# Patient Record
Sex: Male | Born: 1965 | Race: White | Hispanic: No | Marital: Married | Smoking: Light tobacco smoker
Health system: Southern US, Community
[De-identification: ages and names within clinical notes are randomized; demographics above are authoritative.]

## PROBLEM LIST (undated history)

## (undated) DIAGNOSIS — E119 Type 2 diabetes mellitus without complications: Secondary | ICD-10-CM

## (undated) HISTORY — PX: OTHER SURGICAL HISTORY: SHX169

## (undated) HISTORY — DX: Type 2 diabetes mellitus without complications: E11.9

---

## 2009-08-09 ENCOUNTER — Ambulatory Visit (HOSPITAL_COMMUNITY): Admission: RE | Admit: 2009-08-09 | Discharge: 2009-08-09 | Payer: Self-pay | Admitting: Urology

## 2011-06-03 IMAGING — CR DG ABDOMEN 1V
2 series · 2 of 2 positions shown · non-contrast
Comparison: None available.

CLINICAL DATA: Left renal pelvis calculus.  Through the operative
examination for lithotripsy.

ABDOMEN - 1 VIEW

[t abdomen supine (1 of 2)]
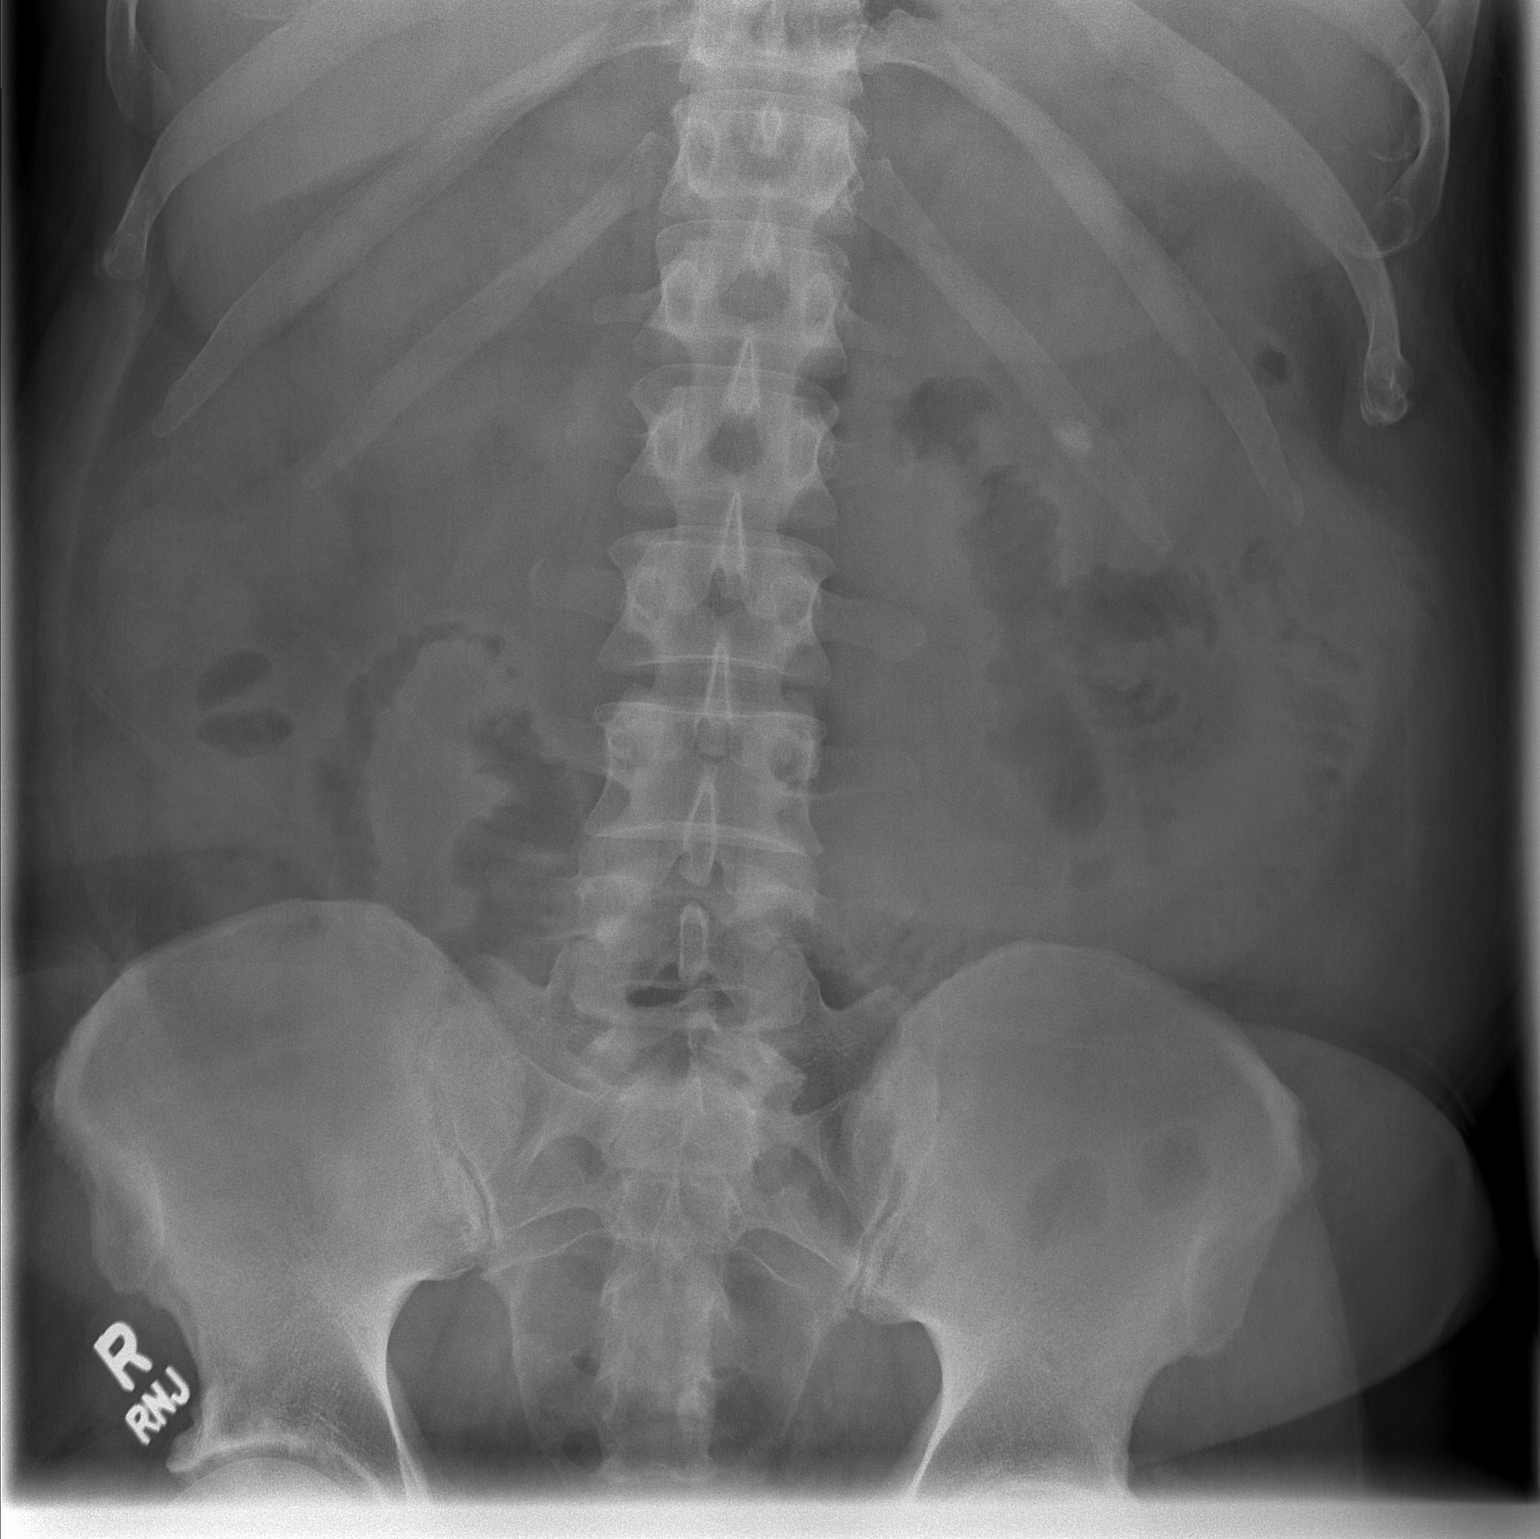

[t abdomen supine (2 of 2)]
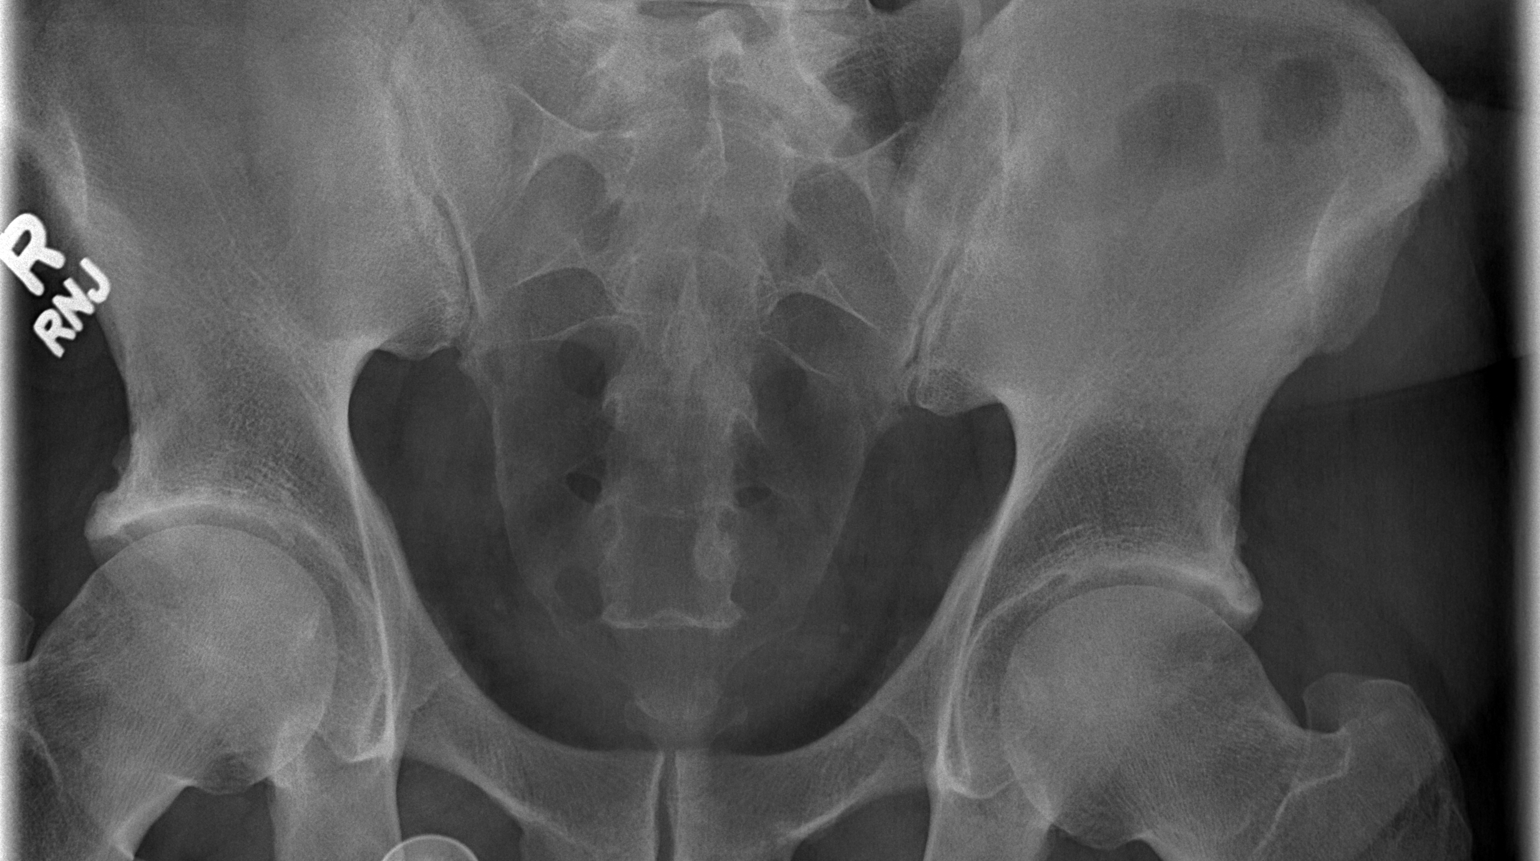

[2 of 2 positions shown; findings below may reference images not displayed]

FINDINGS: 1 cm calculus is projected over the left inferior renal
pelvis.  The bowel gas pattern appears within normal limits.  The
bones are unremarkable.  No right renal calculi are identified.
The phleboliths present in the anatomic pelvis.
IMPRESSION: 1 cm left inferior renal pelvis calculus.

## 2013-03-17 ENCOUNTER — Ambulatory Visit (INDEPENDENT_AMBULATORY_CARE_PROVIDER_SITE_OTHER): Payer: Managed Care, Other (non HMO) | Admitting: Emergency Medicine

## 2013-03-17 VITALS — BP 130/84 | HR 82 | Temp 98.0°F | Resp 18 | Ht 73.0 in | Wt 272.6 lb

## 2013-03-17 DIAGNOSIS — Z202 Contact with and (suspected) exposure to infections with a predominantly sexual mode of transmission: Secondary | ICD-10-CM

## 2013-03-17 DIAGNOSIS — E118 Type 2 diabetes mellitus with unspecified complications: Secondary | ICD-10-CM

## 2013-03-17 DIAGNOSIS — N529 Male erectile dysfunction, unspecified: Secondary | ICD-10-CM

## 2013-03-17 DIAGNOSIS — Z0000 Encounter for general adult medical examination without abnormal findings: Secondary | ICD-10-CM

## 2013-03-17 DIAGNOSIS — E119 Type 2 diabetes mellitus without complications: Secondary | ICD-10-CM | POA: Insufficient documentation

## 2013-03-17 DIAGNOSIS — E1165 Type 2 diabetes mellitus with hyperglycemia: Secondary | ICD-10-CM

## 2013-03-17 DIAGNOSIS — R635 Abnormal weight gain: Secondary | ICD-10-CM

## 2013-03-17 LAB — POCT CBC
HCT, POC: 43.7 % (ref 43.5–53.7)
Hemoglobin: 14.1 g/dL (ref 14.1–18.1)
Lymph, poc: 2.3 (ref 0.6–3.4)
MCH, POC: 30.1 pg (ref 27–31.2)
MCHC: 32.3 g/dL (ref 31.8–35.4)
WBC: 7.9 10*3/uL (ref 4.6–10.2)

## 2013-03-17 LAB — POCT URINALYSIS DIPSTICK
Glucose, UA: 100
Ketones, UA: NEGATIVE
Spec Grav, UA: 1.025
Urobilinogen, UA: 0.2

## 2013-03-17 MED ORDER — SILDENAFIL CITRATE 100 MG PO TABS: 50.0000 mg | ORAL_TABLET | Freq: Every day | ORAL | Status: DC | PRN

## 2013-03-17 MED ORDER — METFORMIN HCL 500 MG PO TABS: ORAL_TABLET | ORAL | Status: DC

## 2013-03-17 NOTE — Progress Notes (Signed)
  Subjective:    Patient ID: Timothy Richards, male    DOB: [birth year], 47 y.o.   MRN: [id number]  HPI Patient comes into office today for a CPE     Review of Systems  Cardiovascular: Positive for leg swelling.       Right lower leg has some swelling to it  Genitourinary: Negative for dysuria, frequency, hematuria, discharge, penile swelling, scrotal swelling, penile pain and testicular pain.       Patient is worried that his penis isn't functioning right Sometimes it stay erect sometimes itll start off erect then goes down  Neurological: Positive for numbness.       And tingling in both of his lower legs       Objective:   Physical Exam HEENT exam is unremarkable. Neck is supple. Chest is clear to auscultation and percussion. Heart regular rate no murmurs rubs or gallops. Abdomen soft nontender. GU exam is normal. Rectal reveals normal prostate. Extremities show 1+ edema both lower legs.  Results for orders placed in visit on 03/17/13  POCT CBC      Result Value Range   WBC 7.9  4.6 - 10.2 K/uL   Lymph, poc 2.3  0.6 - 3.4   POC LYMPH PERCENT 28.6  10 - 50 %L   MID (cbc) 0.4  0 - 0.9   POC MID % 5.1  0 - 12 %M   POC Granulocyte 5.2  2 - 6.9   Granulocyte percent 66.3  37 - 80 %G   RBC 4.68 (*) 4.69 - 6.13 M/uL   Hemoglobin 14.1  14.1 - 18.1 g/dL   HCT, POC 40.9  81.1 - 53.7 %   MCV 93.4  80 - 97 fL   MCH, POC 30.1  27 - 31.2 pg   MCHC 32.3  31.8 - 35.4 g/dL   RDW, POC 91.4     Platelet Count, POC 197  142 - 424 K/uL   MPV 9.4  0 - 99.8 fL  GLUCOSE, POCT (MANUAL RESULT ENTRY)      Result Value Range   POC Glucose 144 (*) 70 - 99 mg/dl  IFOBT (OCCULT BLOOD)      Result Value Range   IFOBT Negative    POCT URINALYSIS DIPSTICK      Result Value Range   Color, UA yellow     Clarity, UA clear     Glucose, UA 100     Bilirubin, UA eng     Ketones, UA neg     Spec Grav, UA 1.025     Blood, UA neg     pH, UA 6.0     Protein, UA neg     Urobilinogen, UA 0.2     Nitrite, UA neg     Leukocytes, UA Negative    POCT GLYCOSYLATED HEMOGLOBIN (HGB A1C)      Result Value Range   Hemoglobin A1C 8.0          Assessment & Plan:  Patient is a newly diagnosed diabetic. He has some signs of neuropathy so I suspect this is been going on for a while. I started him on Glucophage. I gave him information regarding treatment of diabetes to

## 2013-03-17 NOTE — Patient Instructions (Addendum)
Please follow the diet we discussed. Please return and be rechecked in 3 monthsDiabetes, Frequently Asked Questions WHAT IS DIABETES? Most of the food we eat is turned into glucose (sugar). Our bodies use it for energy. The pancreas makes a hormone called insulin. It helps glucose get into the cells of our bodies. When you have diabetes, your body either does not make enough insulin or cannot use its own insulin as well as it should. This causes sugars to build up in your blood. WHAT ARE THE SYMPTOMS OF DIABETES?  Frequent urination.  Excessive thirst.  Unexplained weight loss.  Extreme hunger.  Blurred vision.  Tingling or numbness in hands or feet.  Feeling very tired much of the time.  Dry, itchy skin.  Sores that are slow to heal.  Yeast infections. WHAT ARE THE TYPES OF DIABETES? Type 1 Diabetes   About 10% of affected people have this type.  Usually occurs before the age of 92.  Usually occurs in thin to normal weight people. Type 2 Diabetes  About 90% of affected people have this type.  Usually occurs after the age of 41.  Usually occurs in overweight people.  More likely to have:  A family history of diabetes.  A history of diabetes during pregnancy (gestational diabetes).  High blood pressure.  High cholesterol and triglycerides. Gestational Diabetes  Occurs in about 4% of pregnancies.  Usually goes away after the baby is born.  More likely to occur in women with:  Family history of diabetes.  Previous gestational diabetes.  Obese.  Over 71 years old. WHAT IS PRE-DIABETES? Pre-diabetes means your blood glucose is higher than normal, but lower than the diabetes range. It also means you are at risk of getting type 2 diabetes and heart disease. If you are told you have pre-diabetes, have your blood glucose checked again in 1 to 2 years. WHAT IS THE TREATMENT FOR DIABETES? Treatment is aimed at keeping blood glucose near normal levels at all  times. Learning how to manage this yourself is important in treating diabetes. Depending on the type of diabetes you have, your treatment will include one or more of the following:  Monitoring your blood glucose.  Meal planning.  Exercise.  Oral medicine (pills) or insulin. CAN DIABETES BE PREVENTED? With type 1 diabetes, prevention is more difficult, because the triggers that cause it are not yet known. With type 2 diabetes, prevention is more likely, with lifestyle changes:  Maintain a healthy weight.  Eat healthy.  Exercise. IS THERE A CURE FOR DIABETES? No, there is no cure for diabetes. There is a lot of research going on that is looking for a cure, and progress is being made. Diabetes can be treated and controlled. People with diabetes can manage their diabetes and lead normal, active lives. SHOULD I BE TESTED FOR DIABETES? If you are at least 47 years old, you should be tested for diabetes. You should be tested again every 3 years. If you are 45 or older and overweight, you may want to get tested more often. If you are younger than 45, overweight, and have one or more of the following risk factors, you should be tested:  Family history of diabetes.  Inactive lifestyle.  High blood pressure. WHAT ARE SOME OTHER SOURCES FOR INFORMATION ON DIABETES? The following organizations may help in your search for more information on diabetes: National Diabetes Education Program (NDEP) Internet: [employer] American Diabetes Association Internet: http://www.diabetes.org  Juvenile Diabetes Foundation International Internet: [employer] Document Released:  08/24/2003 Document Revised: 11/13/2011 Document Reviewed: 06/18/2009 ExitCare Patient Information 2014 [place], [place].

## 2013-03-18 LAB — COMPREHENSIVE METABOLIC PANEL
ALT: 17 U/L (ref 0–53)
Albumin: 4.1 g/dL (ref 3.5–5.2)
Alkaline Phosphatase: 68 U/L (ref 39–117)
Glucose, Bld: 156 mg/dL — ABNORMAL HIGH (ref 70–99)
Potassium: 4.2 mEq/L (ref 3.5–5.3)
Sodium: 137 mEq/L (ref 135–145)
Total Protein: 7 g/dL (ref 6.0–8.3)

## 2013-03-18 LAB — LIPID PANEL: Cholesterol: 176 mg/dL (ref 0–200)

## 2013-03-18 LAB — HIV ANTIBODY (ROUTINE TESTING W REFLEX): HIV: NONREACTIVE

## 2013-03-18 LAB — GC/CHLAMYDIA PROBE AMP: GC Probe RNA: NEGATIVE

## 2013-03-18 LAB — RPR

## 2013-03-18 LAB — HEPATITIS C ANTIBODY: HCV Ab: NEGATIVE

## 2013-03-20 ENCOUNTER — Encounter: Payer: Self-pay | Admitting: Radiology

## 2013-06-17 ENCOUNTER — Encounter: Payer: Self-pay | Admitting: Family Medicine

## 2013-06-17 ENCOUNTER — Ambulatory Visit (INDEPENDENT_AMBULATORY_CARE_PROVIDER_SITE_OTHER): Payer: Managed Care, Other (non HMO) | Admitting: Family Medicine

## 2013-06-17 VITALS — BP 142/89 | HR 66 | Temp 98.4°F | Resp 16 | Ht 72.5 in | Wt 254.0 lb

## 2013-06-17 DIAGNOSIS — E781 Pure hyperglyceridemia: Secondary | ICD-10-CM

## 2013-06-17 DIAGNOSIS — E669 Obesity, unspecified: Secondary | ICD-10-CM | POA: Insufficient documentation

## 2013-06-17 DIAGNOSIS — E786 Lipoprotein deficiency: Secondary | ICD-10-CM

## 2013-06-17 DIAGNOSIS — E1165 Type 2 diabetes mellitus with hyperglycemia: Secondary | ICD-10-CM

## 2013-06-17 LAB — LIPID PANEL
HDL: 28 mg/dL — ABNORMAL LOW (ref >39)
LDL Cholesterol: 94 mg/dL (ref 0–99)
Total CHOL/HDL Ratio: 5.7 Ratio
VLDL: 38 mg/dL (ref 0–40)

## 2013-06-17 MED ORDER — METFORMIN HCL 500 MG PO TABS: ORAL_TABLET | ORAL | Status: DC

## 2013-06-17 NOTE — Patient Instructions (Signed)
Your A1c is down to 6.2%.  This is great!  Continue taking the Metformin twice a day with main meals; if you only have 1 large meal in any given day, then just take the medication with that 1 large meal. We want to avoid low blood sugars (<70).

## 2013-06-17 NOTE — Progress Notes (Signed)
S:  This 47 y.o. Cauc male was diagnosed w/ Type II DM in July 2014 and started on Metformin. Current dose is  500 mg twice daily w/ meals; having loose stools multiple times a day. Pt has chronic hx of loose stools; he is not interested in GI consult/ CRS at this time but prefers to wait until he is past age 54.  He has modified nutrition and is exercising, resulting in 18-pound weight loss.  Previous labs were not "fasting" so pt is here to have labs (A1c and lipids) rechecked after 10-hour fast.  Pt was prescribed Viagra at last visit but has not used it; ED symptoms resolved w/ weight loss and improved glycemic control. Pt states he feels the best he has felt in a long time.   Patient Active Problem List   Diagnosis Date Noted  . Obesity, unspecified 06/17/2013  . Type II or unspecified type diabetes mellitus with unspecified complication, uncontrolled 03/17/2013  . Erectile dysfunction 03/17/2013   PMHx, Soc Hx and Fam Hx reviewed.  ROS: AS per HPI>  O: Filed Vitals:   06/17/13 1553  BP: 142/89  Pulse: 66  Temp: 98.4 F (36.9 C)  Resp: 16   GEN: In NAD; WN,WD.  Weight down 18 pounds since July 2014. HENT: [place]/AT; EOMI w/ clear conj/sclerae. Otherwise unremarkable. COR: RRR. No edema. LUNGS: Normal resp rate and effort. SKIN: Ruddy facial complexion but no rashes, ecchymoses, jaundice or pallor. MS: MAEs; no c/c/e. NEURO: A&O x 3; CNs intact. Nonfocal.  Results for orders placed in visit on 06/17/13  POCT GLYCOSYLATED HEMOGLOBIN (HGB A1C)      Result Value Range   Hemoglobin A1C 6.05 March 2013 Lipid panel: TChol= 176   TGs=483   HDL= 29   LDL=    TChol/HDL ratio= 6.1   A/P: Type II or unspecified type diabetes mellitus with unspecified complication, uncontrolled - Improved glycemic control; discussed medication change w/ pt but he prefers to continue w/ current medication and dosing. I offered a combination medication that could reduce Metformin to once a day but  pt declined. He will continue to work on lifestyle changes for weight loss (goal of an additional 20 pounds). 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       Plan: Lipid panel

## 2013-06-19 ENCOUNTER — Encounter: Payer: Self-pay | Admitting: Family Medicine

## 2013-07-10 ENCOUNTER — Other Ambulatory Visit: Payer: Self-pay

## 2013-09-24 ENCOUNTER — Encounter: Payer: Self-pay | Admitting: Family Medicine

## 2013-09-24 ENCOUNTER — Ambulatory Visit (INDEPENDENT_AMBULATORY_CARE_PROVIDER_SITE_OTHER): Payer: Self-pay | Admitting: Family Medicine

## 2013-09-24 VITALS — BP 110/76 | HR 66 | Temp 98.0°F | Resp 16 | Ht 73.0 in | Wt 248.0 lb

## 2013-09-24 DIAGNOSIS — K921 Melena: Secondary | ICD-10-CM | POA: Insufficient documentation

## 2013-09-24 DIAGNOSIS — E119 Type 2 diabetes mellitus without complications: Secondary | ICD-10-CM

## 2013-09-24 DIAGNOSIS — R195 Other fecal abnormalities: Secondary | ICD-10-CM

## 2013-09-24 LAB — CBC WITH DIFFERENTIAL/PLATELET
BASOS ABS: 0 10*3/uL (ref 0.0–0.1)
BASOS PCT: 1 % (ref 0–1)
EOS ABS: 0.1 10*3/uL (ref 0.0–0.7)
EOS PCT: 1 % (ref 0–5)
HEMATOCRIT: 41.2 % (ref 39.0–52.0)
Hemoglobin: 14.2 g/dL (ref 13.0–17.0)
LYMPHS PCT: 28 % (ref 12–46)
Lymphs Abs: 1.9 10*3/uL (ref 0.7–4.0)
MCH: 30.5 pg (ref 26.0–34.0)
MCHC: 34.5 g/dL (ref 30.0–36.0)
MCV: 88.6 fL (ref 78.0–100.0)
MONO ABS: 0.3 10*3/uL (ref 0.1–1.0)
Monocytes Relative: 5 % (ref 3–12)
Neutro Abs: 4.6 10*3/uL (ref 1.7–7.7)
Neutrophils Relative %: 65 % (ref 43–77)
Platelets: 212 10*3/uL (ref 150–400)
RBC: 4.65 MIL/uL (ref 4.22–5.81)
RDW: 14 % (ref 11.5–15.5)
WBC: 7 10*3/uL (ref 4.0–10.5)

## 2013-09-24 LAB — BASIC METABOLIC PANEL
BUN: 18 mg/dL (ref 6–23)
CHLORIDE: 103 meq/L (ref 96–112)
CO2: 24 meq/L (ref 19–32)
Calcium: 9.1 mg/dL (ref 8.4–10.5)
Creat: 0.94 mg/dL (ref 0.50–1.35)
GLUCOSE: 113 mg/dL — AB (ref 70–99)
POTASSIUM: 4.2 meq/L (ref 3.5–5.3)
SODIUM: 137 meq/L (ref 135–145)

## 2013-09-24 LAB — POCT GLYCOSYLATED HEMOGLOBIN (HGB A1C): Hemoglobin A1C: 6

## 2013-09-24 NOTE — Progress Notes (Signed)
S:  This 48 y.o. Cauc male has Type II DM, treated w/ Metformin 500 mg once daily. FSBS is checked a few times /week. Pt had missed medication for > 1 week last month when he forgot to pick up refill. During that time, he felt "hyper" and nervous. These symptoms resolved once he resumed Metformin. No reports of hypoglycemic symptoms.  Pt reports 4-week period of bloody stools; he has hemorrhoids but there appeared to be slot of blood mixed in stools. HE has no fam hx of polyps or other GI disease. No hx of excess OTC NSAIDs or ASA. No change in diet to explain this prolonged bout of bloody stools. No abd pain or cramping. Pt states this has happened previously; as of today, stools have returned to normal.  Patient Active Problem List   Diagnosis Date Noted  . Obesity, unspecified 06/17/2013  . Type II or unspecified type diabetes mellitus with unspecified complication, uncontrolled 03/17/2013  . Erectile dysfunction 03/17/2013   PMHx, Soc Hx and Fam Hx reviewed.    ROS: As per HPI; negative for fatigue, diaphoresis, appetite change, abnormal weight change, vision disturbances, CP or tightness, palpitations, SOB or DOE, cough, orthopnea, back or abd pain, n/v/d or constipation, skin changes, HA, dizziness, numbness, weakness or syncope.  O: Filed Vitals:   09/24/13 0856  BP: 110/76  Pulse: 66  Temp: 98 F (36.7 C)  Resp: 16   GEN: In NAD: WN,WD. Weight down 6 lbs since Oct 2014. HEENT: /AT; EOMI w/ clear conj/sclerae. Wears corrective lenses. Otherwise unremarkable. COR: RRR. LUNGS: Unlabored resp. BACK: No CVAT. ABD: Obese; decreased BS; no guarding and NT w/o HSM or masses. SKIN: W&D; intact w/o diaphoresis, erythema or pallor. See DM Foot Exam. NEURO: A&O x 3; CNs intact. Nonfocal.  Results for orders placed in visit on 09/24/13  POCT GLYCOSYLATED HEMOGLOBIN (HGB A1C)      Result Value Range   Hemoglobin A1C 6.0     A/P: Type II or unspecified type diabetes mellitus without  mention of complication, not stated as uncontrolled - Stable on Metformin 500 mg 1 tab daily. Plan: HM Diabetes Foot Exam, POCT glycosylated hemoglobin (Hb A1C), Basic metabolic panel  Abnormal stools - Plan: CBC with Differential, Ambulatory referral to Gastroenterology  Hematochezia - Plan: CBC with Differential, Ambulatory referral to Gastroenterology

## 2013-09-27 ENCOUNTER — Encounter: Payer: Self-pay | Admitting: Family Medicine

## 2013-10-06 ENCOUNTER — Encounter: Payer: Self-pay | Admitting: Family Medicine

## 2013-11-27 ENCOUNTER — Ambulatory Visit: Payer: Self-pay | Admitting: Emergency Medicine

## 2013-11-27 VITALS — BP 120/78 | HR 79 | Temp 98.2°F | Resp 16 | Ht 72.5 in | Wt 249.4 lb

## 2013-11-27 DIAGNOSIS — Z202 Contact with and (suspected) exposure to infections with a predominantly sexual mode of transmission: Secondary | ICD-10-CM

## 2013-11-27 NOTE — Patient Instructions (Signed)
Sexually Transmitted Disease A sexually transmitted disease (STD) is a disease or infection that may be passed (transmitted) from person to person, usually during sexual activity. This may happen by way of saliva, semen, blood, vaginal mucus, or urine. Common STDs include:   Gonorrhea.   Chlamydia.   Syphilis.   HIV and AIDS.   Genital herpes.   Hepatitis B and C.   Trichomonas.   Human papillomavirus (HPV).   Pubic lice.   Scabies.  Mites.  Bacterial vaginosis. WHAT ARE CAUSES OF STDs? An STD may be caused by bacteria, a virus, or parasites. STDs are often transmitted during sexual activity if one person is infected. However, they may also be transmitted through nonsexual means. STDs may be transmitted after:   Sexual intercourse with an infected person.   Sharing sex toys with an infected person.   Sharing needles with an infected person or using unclean piercing or tattoo needles.  Having intimate contact with the genitals, mouth, or rectal areas of an infected person.   Exposure to infected fluids during birth. WHAT ARE THE SIGNS AND SYMPTOMS OF STDs? Different STDs have different symptoms. Some people may not have any symptoms. If symptoms are present, they may include:   Painful or bloody urination.   Pain in the pelvis, abdomen, vagina, anus, throat, or eyes.   Skin rash, itching, irritation, growths, sores (lesions), ulcerations, or warts in the genital or anal area.  Abnormal vaginal discharge with or without bad odor.   Penile discharge in men.   Fever.   Pain or bleeding during sexual intercourse.   Swollen glands in the groin area.   Yellow skin and eyes (jaundice). This is seen with hepatitis.   Swollen testicles.  Infertility.  Sores and blisters in the mouth. HOW ARE STDs DIAGNOSED? To make a diagnosis, your health care provider may:   Take a medical history.   Perform a physical exam.   Take a sample of any  discharge for examination.  Swab the throat, cervix, opening to the penis, rectum, or vagina for testing.  Test a sample of your first morning urine.   Perform blood tests.   Perform a Pap smear, if this applies.   Perform a colposcopy.   Perform a laparoscopy.  HOW ARE STDs TREATED? Treatment depends on the STD. Some STDs may be treated but not cured.   Chlamydia, gonorrhea, trichomonas, and syphilis can be cured with antibiotics.   Genital herpes, hepatitis, and HIV can be treated, but not cured, with prescribed medicines. The medicines lessen symptoms.   Genital warts from HPV can be treated with medicine or by freezing, burning (electrocautery), or surgery. Warts may come back.   HPV cannot be cured with medicine or surgery. However, abnormal areas may be removed from the cervix, vagina, or vulva.   If your diagnosis is confirmed, your recent sexual partners need treatment. This is true even if they are symptom-free or have a negative culture or evaluation. They should not have sex until their health care providers say it is OK. HOW CAN I REDUCE MY RISK OF GETTING AN STD?  Use latex condoms, dental dams, and water-soluble lubricants during sexual activity. Do not use petroleum jelly or oils.  Get vaccinated for HPV and hepatitis. If you have not received these vaccines in the past, talk to your health care provider about whether one or both might be right for you.   Avoid risky sex practices that can break the skin.  WHAT SHOULD   I DO IF I THINK I HAVE AN STD?  See your health care provider.   Inform all sexual partners. They should be tested and treated for any STDs.  Do not have sex until your health care provider says it is OK. WHEN SHOULD I GET HELP? Seek immediate medical care if:  You develop severe abdominal pain.  You are a man and notice swelling or pain in the testicles.  You are a woman and notice swelling or pain in your vagina. Document  Released: 11/11/2002 Document Revised: 06/11/2013 Document Reviewed: 03/11/2013 ExitCare Patient Information 2014 [employer].  

## 2013-11-27 NOTE — Progress Notes (Signed)
[facility] [address], [place] [address] [phone]  Date:  11/27/2013   Name:  Timothy Richards   DOB:  1965-12-27   MRN:  [id number]  PCP:  No primary provider on file.    Chief Complaint: SEXUALLY TRANSMITTED DISEASE   History of Present Illness:  Timothy Richards is a 48 y.o. very pleasant male patient who presents with the following:  Has two partners in past 6 months and wants to be tested for STD's. Not using condoms.  No symptoms.  No history of STD.  Denies other complaint or health concern today.   Patient Active Problem List   Diagnosis Date Noted  . Hematochezia 09/24/2013  . Obesity, unspecified 06/17/2013  . Type II or unspecified type diabetes mellitus with unspecified complication, uncontrolled 03/17/2013  . Erectile dysfunction 03/17/2013    No past medical history on file.  Past Surgical History  Procedure Laterality Date  . Kidney stones      History  Substance Use Topics  . Smoking status: Never Smoker   . Smokeless tobacco: Not on file  . Alcohol Use: Yes    Family History  Problem Relation Age of Onset  . Diabetes Father     No Known Allergies  Medication list has been reviewed and updated.  Current Outpatient Prescriptions on File Prior to Visit  Medication Sig Dispense Refill  . metFORMIN (GLUCOPHAGE) 500 MG tablet Take one tablet twice a day with main meals.  60 tablet  3   No current facility-administered medications on file prior to visit.    Review of Systems:  As per HPI, otherwise negative.    Physical Examination: Filed Vitals:   11/27/13 1737  BP: 120/78  Pulse: 79  Temp: 98.2 F (36.8 C)  Resp: 16   Filed Vitals:   11/27/13 1737  Height: 6' 0.5" (1.842 m)  Weight: 249 lb 6.4 oz (113.127 kg)   Body mass index is 33.34 kg/(m^2). Ideal Body Weight: Weight in (lb) to have BMI = 25: 186.5   GEN: WDWN, NAD, Non-toxic, Alert & Oriented x 3 HEENT: Atraumatic, Normocephalic.  Ears  and Nose: No external deformity. EXTR: No clubbing/cyanosis/edema NEURO: Normal gait.  PSYCH: Normally interactive. Conversant. Not depressed or anxious appearing.  Calm demeanor.    Assessment and Plan: STD check Labs   Signed,  Phillips OdorJeffery Cova Knieriem, MD

## 2013-11-28 LAB — RPR

## 2013-11-29 LAB — GC/CHLAMYDIA PROBE AMP
CT Probe RNA: NEGATIVE
GC Probe RNA: NEGATIVE

## 2013-12-01 ENCOUNTER — Encounter: Payer: Self-pay | Admitting: *Deleted

## 2014-01-13 ENCOUNTER — Other Ambulatory Visit: Payer: Self-pay | Admitting: Family Medicine

## 2014-01-15 ENCOUNTER — Other Ambulatory Visit: Payer: Self-pay | Admitting: Family Medicine

## 2014-01-22 ENCOUNTER — Ambulatory Visit: Payer: Self-pay | Admitting: Family Medicine

## 2014-02-13 ENCOUNTER — Other Ambulatory Visit: Payer: Self-pay | Admitting: Emergency Medicine

## 2014-03-27 ENCOUNTER — Other Ambulatory Visit: Payer: Self-pay | Admitting: Physician Assistant

## 2014-04-23 ENCOUNTER — Other Ambulatory Visit: Payer: Self-pay | Admitting: Physician Assistant

## 2014-05-26 ENCOUNTER — Other Ambulatory Visit: Payer: Self-pay | Admitting: Physician Assistant

## 2014-06-09 ENCOUNTER — Other Ambulatory Visit: Payer: Self-pay | Admitting: Physician Assistant

## 2014-10-21 ENCOUNTER — Ambulatory Visit (INDEPENDENT_AMBULATORY_CARE_PROVIDER_SITE_OTHER): Payer: Self-pay | Admitting: Family Medicine

## 2014-10-21 ENCOUNTER — Encounter: Payer: Self-pay | Admitting: Family Medicine

## 2014-10-21 VITALS — BP 140/86 | HR 87 | Temp 98.2°F | Resp 16 | Ht 73.0 in | Wt 269.0 lb

## 2014-10-21 DIAGNOSIS — Z23 Encounter for immunization: Secondary | ICD-10-CM

## 2014-10-21 DIAGNOSIS — K642 Third degree hemorrhoids: Secondary | ICD-10-CM

## 2014-10-21 DIAGNOSIS — N521 Erectile dysfunction due to diseases classified elsewhere: Secondary | ICD-10-CM

## 2014-10-21 DIAGNOSIS — Z113 Encounter for screening for infections with a predominantly sexual mode of transmission: Secondary | ICD-10-CM

## 2014-10-21 DIAGNOSIS — Z0000 Encounter for general adult medical examination without abnormal findings: Secondary | ICD-10-CM

## 2014-10-21 DIAGNOSIS — E1169 Type 2 diabetes mellitus with other specified complication: Secondary | ICD-10-CM

## 2014-10-21 DIAGNOSIS — K625 Hemorrhage of anus and rectum: Secondary | ICD-10-CM

## 2014-10-21 DIAGNOSIS — E669 Obesity, unspecified: Secondary | ICD-10-CM

## 2014-10-21 LAB — CBC WITH DIFFERENTIAL/PLATELET
BASOS PCT: 1 % (ref 0–1)
Basophils Absolute: 0.1 10*3/uL (ref 0.0–0.1)
EOS ABS: 0.3 10*3/uL (ref 0.0–0.7)
EOS PCT: 4 % (ref 0–5)
HEMATOCRIT: 39.5 % (ref 39.0–52.0)
HEMOGLOBIN: 13.2 g/dL (ref 13.0–17.0)
LYMPHS ABS: 2.4 10*3/uL (ref 0.7–4.0)
Lymphocytes Relative: 35 % (ref 12–46)
MCH: 29.3 pg (ref 26.0–34.0)
MCHC: 33.4 g/dL (ref 30.0–36.0)
MCV: 87.6 fL (ref 78.0–100.0)
MONO ABS: 0.3 10*3/uL (ref 0.1–1.0)
MONOS PCT: 5 % (ref 3–12)
MPV: 10.4 fL (ref 8.6–12.4)
Neutro Abs: 3.8 10*3/uL (ref 1.7–7.7)
Neutrophils Relative %: 55 % (ref 43–77)
Platelets: 212 10*3/uL (ref 150–400)
RBC: 4.51 MIL/uL (ref 4.22–5.81)
RDW: 13.9 % (ref 11.5–15.5)
WBC: 6.9 10*3/uL (ref 4.0–10.5)

## 2014-10-21 LAB — POCT URINALYSIS DIPSTICK
Bilirubin, UA: NEGATIVE
Blood, UA: NEGATIVE
GLUCOSE UA: NEGATIVE
Ketones, UA: NEGATIVE
Leukocytes, UA: NEGATIVE
NITRITE UA: NEGATIVE
Protein, UA: NEGATIVE
SPEC GRAV UA: 1.02
Urobilinogen, UA: 0.2
pH, UA: 6

## 2014-10-21 LAB — POCT GLYCOSYLATED HEMOGLOBIN (HGB A1C): Hemoglobin A1C: 7.6

## 2014-10-21 MED ORDER — METFORMIN HCL 500 MG PO TABS: 500.0000 mg | ORAL_TABLET | Freq: Two times a day (BID) | ORAL | Status: DC

## 2014-10-21 NOTE — Patient Instructions (Addendum)
Keeping you healthy  Get these tests  Blood pressure- Have your blood pressure checked once a year by your healthcare provider.  Normal blood pressure is 120/80  Weight- Have your body mass index (BMI) calculated to screen for obesity.  BMI is a measure of body fat based on height and weight. You can also calculate your own BMI at [email or web address].  Cholesterol- Have your cholesterol checked every year.  Diabetes- Have your blood sugar checked regularly if you have high blood pressure, high cholesterol, have a family history of diabetes or if you are overweight.  Screening for Colon Cancer- Colonoscopy starting at age 17.  Screening may begin sooner depending on your family history and other health conditions. Follow up colonoscopy as directed by your Gastroenterologist. The cause of your rectal bleeding is most likely due to very large hemorrhoids. You will need to see a general surgeon about repair/removal.  Screening for Prostate Cancer- Both blood work (PSA) and a rectal exam help screen for Prostate Cancer.  Screening begins at age 18 with African-American men and at age 60 with Caucasian men.  Screening may begin sooner depending on your family history.  Take these medicines  Aspirin- One aspirin daily can help prevent Heart disease and Stroke.  Flu shot- Every fall.  Tetanus- Every 10 years.  Zostavax- Once after the age of 79 to prevent Shingles.  Pneumonia shot- Once after the age of 46; if you are younger than 101, ask your healthcare provider if you need a Pneumonia shot.  Take these steps  Don't smoke- If you do smoke, talk to your doctor about quitting.  For tips on how to quit, go to www.smokefree.gov or call 1-800-QUIT-NOW.  Be physically active- Exercise 5 days a week for at least 30 minutes.  If you are not already physically active start slow and gradually work up to 30 minutes of moderate physical activity.  Examples of moderate activity include walking  briskly, mowing the yard, dancing, swimming, bicycling, etc.  Eat a healthy diet- Eat a variety of healthy food such as fruits, vegetables, low fat milk, low fat cheese, yogurt, lean meant, poultry, fish, beans, tofu, etc. For more information go to www.thenutritionsource.org  Drink alcohol in moderation- Limit alcohol intake to less than two drinks a day. Never drink and drive.  Dentist- Brush and floss twice daily; visit your dentist twice a year.  Depression- Your emotional health is as important as your physical health. If you're feeling down, or losing interest in things you would normally enjoy please talk to your healthcare provider.  Eye exam- Visit your eye doctor every year.  Safe sex- If you may be exposed to a sexually transmitted infection, use a condom.  Seat belts- Seat belts can save your life; always wear one.  Smoke/Carbon Monoxide detectors- These detectors need to be installed on the appropriate level of your home.  Replace batteries at least once a year.  Skin cancer- When out in the sun, cover up and use sunscreen 15 SPF or higher.  Violence- If anyone is threatening you, please tell your healthcare provider.  Living Will/ Health care power of attorney- Speak with your healthcare provider and family.       Mediterranean Diet  Why follow it? Research shows. . Those who follow the Mediterranean diet have a reduced risk of heart disease  . The diet is associated with a reduced incidence of Parkinson's and Alzheimer's diseases . People following the diet may have longer life expectancies and lower  rates of chronic diseases  . The Dietary Guidelines for Americans recommends the Mediterranean diet as an eating plan to promote health and prevent disease  What Is the Mediterranean Diet?  . Healthy eating plan based on typical foods and recipes of Mediterranean-style cooking . The diet is primarily a plant based diet; these foods should make up a majority of meals    Starches - Plant based foods should make up a majority of meals - They are an important sources of vitamins, minerals, energy, antioxidants, and fiber - Choose whole grains, foods high in fiber and minimally processed items  - Typical grain sources include wheat, oats, barley, corn, brown rice, bulgar, farro, millet, polenta, couscous  - Various types of beans include chickpeas, lentils, fava beans, black beans, white beans   Fruits  Veggies - Large quantities of antioxidant rich fruits & veggies; 6 or more servings  - Vegetables can be eaten raw or lightly drizzled with oil and cooked  - Vegetables common to the traditional Mediterranean Diet include: artichokes, arugula, beets, broccoli, brussel sprouts, cabbage, carrots, celery, collard greens, cucumbers, eggplant, kale, leeks, lemons, lettuce, mushrooms, okra, onions, peas, peppers, potatoes, pumpkin, radishes, rutabaga, shallots, spinach, sweet potatoes, turnips, zucchini - Fruits common to the Mediterranean Diet include: apples, apricots, avocados, cherries, clementines, dates, figs, grapefruits, grapes, melons, nectarines, oranges, peaches, pears, pomegranates, strawberries, tangerines  Fats - Replace butter and margarine with healthy oils, such as olive oil, canola oil, and tahini  - Limit nuts to no more than a handful a day  - Nuts include walnuts, almonds, pecans, pistachios, pine nuts  - Limit or avoid candied, honey roasted or heavily salted nuts - Olives are central to the [employer] - can be eaten whole or used in a variety of dishes   Meats Protein - Limiting red meat: no more than a few times a month - When eating red meat: choose lean cuts and keep the portion to the size of deck of cards - Eggs: approx. 0 to 4 times a week  - Fish and lean poultry: at least 2 a week  - Healthy protein sources include, chicken, [place], lean beef, lamb - Increase intake of seafood such as tuna, salmon, trout, mackerel, shrimp,  scallops - Avoid or limit high fat processed meats such as sausage and bacon  Dairy - Include moderate amounts of low fat dairy products  - Focus on healthy dairy such as fat free yogurt, skim milk, low or reduced fat cheese - Limit dairy products higher in fat such as whole or 2% milk, cheese, ice cream  Alcohol - Moderate amounts of red wine is ok  - No more than 5 oz daily for women (all ages) and men older than age 61  - No more than 10 oz of wine daily for men younger than 31  Other - Limit sweets and other desserts  - Use herbs and spices instead of salt to flavor foods  - Herbs and spices common to the traditional Mediterranean Diet include: basil, bay leaves, chives, cloves, cumin, fennel, garlic, lavender, marjoram, mint, oregano, parsley, pepper, rosemary, sage, savory, sumac, tarragon, thyme   It's not just a diet, it's a lifestyle:  . The Mediterranean diet includes lifestyle factors typical of those in the region  . Foods, drinks and meals are best eaten with others and savored . Daily physical activity is important for overall good health . This could be strenuous exercise like running and aerobics . This could also  be more leisurely activities such as walking, housework, yard-work, or taking the stairs . Moderation is the key; a balanced and healthy diet accommodates most foods and drinks . Consider portion sizes and frequency of consumption of certain foods   Meal Ideas & Options:  . Breakfast:  o Whole wheat toast or whole wheat English muffins with peanut butter & hard boiled egg o Steel cut oats topped with apples & cinnamon and skim milk  o Fresh fruit: banana, strawberries, melon, berries, peaches  o Smoothies: strawberries, bananas, greek yogurt, peanut butter o Low fat greek yogurt with blueberries and granola  o Egg white omelet with spinach and mushrooms o Breakfast couscous: whole wheat couscous, apricots, skim milk, cranberries  . Sandwiches:  o Hummus and  grilled vegetables (peppers, zucchini, squash) on whole wheat bread   o Grilled chicken on whole wheat pita with lettuce, tomatoes, cucumbers or tzatziki  o Tuna salad on whole wheat bread: tuna salad made with greek yogurt, olives, red peppers, capers, green onions o Garlic rosemary lamb pita: lamb sauted with garlic, rosemary, salt & pepper; add lettuce, cucumber, greek yogurt to pita - flavor with lemon juice and black pepper  . Seafood:  o Mediterranean grilled salmon, seasoned with garlic, basil, parsley, lemon juice and black pepper o Shrimp, lemon, and spinach whole-grain pasta salad made with low fat greek yogurt  o Seared scallops with lemon orzo  o Seared tuna steaks seasoned salt, pepper, coriander topped with tomato mixture of olives, tomatoes, olive oil, minced garlic, parsley, green onions and cappers  . Meats:  o Herbed greek chicken salad with kalamata olives, cucumber, feta  o Red bell peppers stuffed with spinach, bulgur, lean ground beef (or lentils) & topped with feta   o Kebabs: skewers of chicken, tomatoes, onions, zucchini, squash  o [place] burgers: made with red onions, mint, dill, lemon juice, feta cheese topped with roasted red peppers . Vegetarian o Cucumber salad: cucumbers, artichoke hearts, celery, red onion, feta cheese, tossed in olive oil & lemon juice  o Hummus and whole grain pita points with a greek salad (lettuce, tomato, feta, olives, cucumbers, red onion) o Lentil soup with celery, carrots made with vegetable broth, garlic, salt and pepper  o Tabouli salad: parsley, bulgur, mint, scallions, cucumbers, tomato, radishes, lemon juice, olive oil, salt and pepper. o    I am ordering a consultation with a general surgeon to discuss hemorrhoid treatment/surgery. You will need to do the home stool collection test; if it is positive, we will then work on getting you seen by a GI specialist for a colonoscopy.  You will be contacted about the A1c result today.  Start back taking Metformin twice a day with meals. Increase water intake and discontinue sodas and other sugary drinks. Refer to the Mediterranean Diet as a guide for healthy nutrition until you see the nutritionist.

## 2014-10-22 LAB — COMPLETE METABOLIC PANEL WITH GFR
ALK PHOS: 60 U/L (ref 39–117)
ALT: 13 U/L (ref 0–53)
AST: 16 U/L (ref 0–37)
Albumin: 3.9 g/dL (ref 3.5–5.2)
BILIRUBIN TOTAL: 0.7 mg/dL (ref 0.2–1.2)
BUN: 18 mg/dL (ref 6–23)
CO2: 24 mEq/L (ref 19–32)
CREATININE: 0.88 mg/dL (ref 0.50–1.35)
Calcium: 9 mg/dL (ref 8.4–10.5)
Chloride: 104 mEq/L (ref 96–112)
GLUCOSE: 133 mg/dL — AB (ref 70–99)
Potassium: 4.1 mEq/L (ref 3.5–5.3)
SODIUM: 138 meq/L (ref 135–145)
TOTAL PROTEIN: 7 g/dL (ref 6.0–8.3)

## 2014-10-22 LAB — LIPID PANEL
CHOL/HDL RATIO: 5.1 ratio
Cholesterol: 154 mg/dL (ref 0–200)
HDL: 30 mg/dL — AB (ref >39)
LDL CALC: 56 mg/dL (ref 0–99)
Triglycerides: 338 mg/dL — ABNORMAL HIGH (ref <150)
VLDL: 68 mg/dL — AB (ref 0–40)

## 2014-10-22 LAB — THYROID PANEL WITH TSH
FREE THYROXINE INDEX: 1.9 (ref 1.4–3.8)
T3 UPTAKE: 28 % (ref 22–35)
T4, Total: 6.8 ug/dL (ref 4.5–12.0)
TSH: 1.404 u[IU]/mL (ref 0.350–4.500)

## 2014-10-22 LAB — HSV(HERPES SIMPLEX VRS) I + II AB-IGG

## 2014-10-22 LAB — HIV ANTIBODY (ROUTINE TESTING W REFLEX): HIV 1&2 Ab, 4th Generation: NONREACTIVE

## 2014-10-22 LAB — RPR

## 2014-10-23 NOTE — Progress Notes (Signed)
Subjective:    Patient ID: Timothy Richards, male    DOB: 01-15-1966, 49 y.o.   MRN: [id number]  HPI  This 49 y.o. Male is here for CPE; he has Type II DM but has been noncompliant with treatment. He states he "has never been formally diagnosed with diabetes". Metformin prescribed but pt out of med x 2 months and not checking FSBS ("can't find glucometer"). Pt is not following a meal plan and does not exercise. Has gained weight and c/o erectile dysfunction in last 3-4 months.  He is here w/ his "support group"- his ex-girlfriend and best friend, Lurena Joiner and his current girlfriend, Aundra Millet. These individuals practice an alternative lifestyle called "polyamory". Currently the pt and Aundra Millet are in a monogamous relationship but they will be opening their relationship to include other individual(s). For this reason, he is needing STD screen; the male have tested negative for STDs.   Pt and Lurena Joiner report a 10-year hx of rectal bleeding; pt has not had CRS and there is no family hx of colorectal cancer. He reports hemorrhoids and intermittent constipation.  Immunizations- pt gives hx of multiple bouts of pneumonia.  Pt endorses loud snoring for many years. He and ex-wife slept in separate bedrooms for last 9 years of their marriage.  Patient Active Problem List   Diagnosis Date Noted  . Hematochezia 09/24/2013  . Obesity, unspecified 06/17/2013  . Type II or unspecified type diabetes mellitus with unspecified complication, uncontrolled 03/17/2013  . Erectile dysfunction 03/17/2013    Prior to Admission medications   Medication Sig Start Date End Date Taking? Authorizing Provider  metFORMIN (GLUCOPHAGE) 500 MG tablet Take 1 tablet (500 mg total) by mouth 2 (two) times daily with a meal.   No Huey Romans, PA-C   Past Surgical History  Procedure Laterality Date  . Kidney stones      Family History  Problem Relation Age of Onset  . Diabetes Father   . Heart disease Father   . Heart  disease Mother   . Stroke Mother   . Heart disease Brother     Review of Systems  Constitutional: Positive for unexpected weight change.  HENT: Negative.   Eyes: Negative.   Respiratory: Negative.   Cardiovascular: Negative.   Gastrointestinal: Positive for constipation, blood in stool and anal bleeding. Negative for nausea, vomiting, abdominal pain and abdominal distention.  Endocrine: Negative.   Musculoskeletal: Negative.   Skin: Negative.   Allergic/Immunologic: Negative.   Neurological: Negative.   Psychiatric/Behavioral: Negative.       Objective:   Physical Exam  Constitutional: He is oriented to person, place, and time. He appears well-developed and well-nourished. No distress.  Blood pressure 140/86, pulse 87, temperature 98.2 F (36.8 C), resp. rate 16, height  (1.854 m), weight 269 lb (122.018 kg), SpO2 95 %.   BMI= 35.49 kg/m2   HENT:  Head: Normocephalic and atraumatic.  Right Ear: Hearing, tympanic membrane, external ear and ear canal normal.  Left Ear: Hearing, tympanic membrane, external ear and ear canal normal.  Nose: Nose normal. No nasal deformity or septal deviation.  Mouth/Throat: Uvula is midline, oropharynx is clear and moist and mucous membranes are normal. No oral lesions. Normal dentition. No dental caries.  Eyes: Conjunctivae, EOM and lids are normal. Pupils are equal, round, and reactive to light. No scleral icterus.  Neck: Trachea normal, normal range of motion, full passive range of motion without pain and phonation normal. Neck supple. No spinous process tenderness and no  muscular tenderness present. No thyroid mass and no thyromegaly present.  Neck circumference= 16-17 inches.  Cardiovascular: Normal rate, regular rhythm, S1 normal, S2 normal, normal heart sounds, intact distal pulses and normal pulses.   No extrasystoles are present. PMI is not displaced.  Exam reveals no gallop and no friction rub.   No murmur heard. Pulmonary/Chest: Effort  normal and breath sounds normal. No respiratory distress. He has no decreased breath sounds. He has no wheezes.  Abdominal: Soft. Normal appearance and bowel sounds are normal. He exhibits no distension, no pulsatile midline mass and no mass. There is no hepatosplenomegaly. There is no tenderness. There is no guarding and no CVA tenderness.  Genitourinary: Prostate normal. Rectal exam shows external hemorrhoid and internal hemorrhoid. Rectal exam shows no mass, no tenderness and anal tone normal. Guaiac negative stool. Prostate is not enlarged and not tender.  Very large hemorrhoids, not actively bleeding.  Musculoskeletal:       Cervical back: Normal.       Thoracic back: Normal.       Lumbar back: Normal.  Remainder of exam unremarkable.  Lymphadenopathy:       Head (right side): No submental, no submandibular, no tonsillar, no preauricular, no posterior auricular and no occipital adenopathy present.       Head (left side): No submental, no submandibular, no tonsillar, no preauricular, no posterior auricular and no occipital adenopathy present.    He has no cervical adenopathy.    He has no axillary adenopathy.       Right: No inguinal and no supraclavicular adenopathy present.       Left: No inguinal and no supraclavicular adenopathy present.  Neurological: He is alert and oriented to person, place, and time. He has normal strength. He displays no atrophy. No cranial nerve deficit or sensory deficit. He exhibits normal muscle tone. Coordination and gait normal.  DTRs - not able to illicit.  Skin: Skin is warm, dry and intact. No ecchymosis, no lesion and no rash noted. He is not diaphoretic. No cyanosis or erythema. No pallor. Nails show no clubbing.  Psychiatric: He has a normal mood and affect. His speech is normal and behavior is normal. Judgment and thought content normal. Cognition and memory are normal.  Nursing note and vitals reviewed.   A1c= 7.6%     Assessment & Plan:    Encounter for routine history and physical exam for male - Plan: CBC with Differential/Platelet  Diabetes mellitus type 2 in obese - encouraged compliance w/ medication and nutrition modification. 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 Plan: POCT glycosylated hemoglobin (Hb A1C), COMPLETE METABOLIC PANEL WITH GFR, Lipid panel, Thyroid Panel With TSH, CBC with Differential/Platelet, For home use only DME Glucometer, Ambulatory referral to diabetic education, POCT urinalysis dipstick  Screening for STDs (sexually transmitted diseases) - Plan: HSV(herpes simplex vrs) 1+2 ab-IgG, RPR, HIV antibody (with reflex)  Third degree hemorrhoids - Plan: Testosterone, free, total, IFOBT POC (occult bld, rslt in office), Ambulatory referral to General Surgery  Erectile dysfunction due to diseases classified elsewhere - Suspect ED related to uncontrolled DM and increase weight. Plan: Thyroid Panel With TSH, Testosterone, free, total  Need for Tdap vaccination - Plan: Tdap vaccine greater than or equal to 7yo IM  Rectal bleeding - Plan: Ambulatory referral to General Surgery  Need for prophylactic vaccination with Streptococcus pneumoniae (Pneumococcus) and Influenza vaccines - Plan: Pneumococcal conjugate vaccine 13-valent IM  Meds ordered this encounter  Medications  . metFORMIN (GLUCOPHAGE) 500 MG tablet    Sig: Take 1 tablet (500 mg total) by mouth 2 (two) times daily with a meal.    Dispense:  60 tablet    Refill:  3   Need to discuss referral for SLEEP STUDY at next visit.

## 2014-11-05 ENCOUNTER — Encounter: Payer: Self-pay | Attending: Family Medicine

## 2014-11-05 VITALS — Ht 73.5 in | Wt 268.8 lb

## 2014-11-05 DIAGNOSIS — E119 Type 2 diabetes mellitus without complications: Secondary | ICD-10-CM | POA: Insufficient documentation

## 2014-11-05 DIAGNOSIS — Z713 Dietary counseling and surveillance: Secondary | ICD-10-CM | POA: Insufficient documentation

## 2014-11-06 NOTE — Progress Notes (Signed)

## 2014-11-12 DIAGNOSIS — E119 Type 2 diabetes mellitus without complications: Secondary | ICD-10-CM

## 2014-11-12 NOTE — Progress Notes (Signed)

## 2014-11-24 NOTE — Progress Notes (Signed)
Patient was seen on 11/19/14 for the third of a series of three diabetes self-management courses at the [facility]. The following learning objectives were met by the patient during this class:  . State the amount of activity recommended for healthy living . Describe activities suitable for individual needs . Identify ways to regularly incorporate activity into daily life . Identify barriers to activity and ways to over come these barriers  Identify diabetes medications being personally used and their primary action for lowering glucose and possible side effects . Describe role of stress on blood glucose and develop strategies to address psychosocial issues . Identify diabetes complications and ways to prevent them  Explain how to manage diabetes during illness . Evaluate success in meeting personal goal . Establish 2-3 goals that they will plan to diligently work on until they return for the  60-monthfollow-up visit  Goals:   I will count my carb choices at most meals and snacks  I will be active 30 minutes or more 3-5 times a week  I will take my diabetes medications as scheduled  I will eat less unhealthy fats by eating less Red Meat  I will test my glucose at least 1 times a day, 7 days a week  I will look at patterns in my record book at least 4 days a month  To help manage stress I will  Exercise at least 3-5 times a week  Your patient has identified these potential barriers to change:  Time  Your patient has identified their diabetes self-care support plan as  Family Support Plan:  Attend Core 4 in 4 months

## 2015-03-07 ENCOUNTER — Other Ambulatory Visit: Payer: Self-pay | Admitting: Family Medicine

## 2015-04-09 ENCOUNTER — Other Ambulatory Visit: Payer: Self-pay | Admitting: Urgent Care

## 2015-04-24 ENCOUNTER — Other Ambulatory Visit: Payer: Self-pay | Admitting: Physician Assistant

## 2015-07-07 ENCOUNTER — Encounter: Payer: Self-pay | Admitting: Physician Assistant

## 2015-07-07 ENCOUNTER — Ambulatory Visit (INDEPENDENT_AMBULATORY_CARE_PROVIDER_SITE_OTHER): Payer: Self-pay | Admitting: Physician Assistant

## 2015-07-07 VITALS — BP 128/88 | HR 81 | Temp 98.5°F | Resp 18 | Wt 269.2 lb

## 2015-07-07 DIAGNOSIS — E119 Type 2 diabetes mellitus without complications: Secondary | ICD-10-CM

## 2015-07-07 DIAGNOSIS — Z299 Encounter for prophylactic measures, unspecified: Secondary | ICD-10-CM

## 2015-07-07 DIAGNOSIS — Z23 Encounter for immunization: Secondary | ICD-10-CM

## 2015-07-07 DIAGNOSIS — Z418 Encounter for other procedures for purposes other than remedying health state: Secondary | ICD-10-CM

## 2015-07-07 LAB — POCT CBC
Granulocyte percent: 69.1 %G (ref 37–80)
HEMATOCRIT: 40.3 % — AB (ref 43.5–53.7)
HEMOGLOBIN: 13.8 g/dL — AB (ref 14.1–18.1)
Lymph, poc: 2.3 (ref 0.6–3.4)
MCH: 29.6 pg (ref 27–31.2)
MCHC: 34.2 g/dL (ref 31.8–35.4)
MCV: 86.6 fL (ref 80–97)
MID (CBC): 0.5 (ref 0–0.9)
MPV: 8.2 fL (ref 0–99.8)
POC GRANULOCYTE: 6.1 (ref 2–6.9)
POC LYMPH PERCENT: 25.7 %L (ref 10–50)
POC MID %: 5.2 % (ref 0–12)
Platelet Count, POC: 185 10*3/uL (ref 142–424)
RBC: 4.65 M/uL — AB (ref 4.69–6.13)
RDW, POC: 14 %
WBC: 8.8 10*3/uL (ref 4.6–10.2)

## 2015-07-07 LAB — POCT GLYCOSYLATED HEMOGLOBIN (HGB A1C): Hemoglobin A1C: 8

## 2015-07-07 LAB — HEMOGLOBIN A1C: Hgb A1c MFr Bld: 8 % — AB (ref 4.0–6.0)

## 2015-07-07 MED ORDER — METFORMIN HCL 500 MG PO TABS: 500.0000 mg | ORAL_TABLET | Freq: Two times a day (BID) | ORAL | Status: DC

## 2015-07-07 MED ORDER — ASPIRIN 81 MG PO TABS: 81.0000 mg | ORAL_TABLET | Freq: Every day | ORAL | Status: AC

## 2015-07-07 MED ORDER — ROSUVASTATIN CALCIUM 5 MG PO TABS: 5.0000 mg | ORAL_TABLET | Freq: Every day | ORAL | Status: AC

## 2015-07-07 NOTE — Progress Notes (Signed)
07/07/2015 at 5:41 PM  Timothy Richards / DOB: 10-20-1965 / MRN: [id number]  The patient has Diabetes (HCC); Erectile dysfunction; Obesity, unspecified; and Hematochezia on his problem list.  SUBJECTIVE  Diabetes He presents for his follow-up diabetic visit. He has type 2 diabetes mellitus. His disease course has been stable. Pertinent negatives for hypoglycemia include no dizziness, headaches or sweats. Pertinent negatives for diabetes include no blurred vision, no chest pain, no fatigue, no foot paresthesias, no foot ulcerations, no polydipsia, no polyuria, no visual change and no weakness. Pertinent negatives for diabetic complications include no autonomic neuropathy, CVA, heart disease, impotence, peripheral neuropathy, PVD or retinopathy. Risk factors for coronary artery disease include dyslipidemia, family history, obesity, male sex, tobacco exposure, sedentary lifestyle and stress. Current diabetic treatment includes oral agent (monotherapy). He is compliant with treatment most of the time. His weight is fluctuating minimally. He is following a generally unhealthy diet. He rarely participates in exercise. An ACE inhibitor/angiotensin II receptor blocker is not being taken. He does not see a podiatrist.Eye exam is not current.     He  has a past medical history of Diabetes mellitus without complication (HCC).    Medications reviewed and updated by myself where necessary, and exist elsewhere in the encounter.   Mr. Marlett has No Known Allergies. He  reports that he has been smoking Pipe.  He does not have any smokeless tobacco history on file. He reports that he drinks alcohol. He reports that he does not use illicit drugs. He  reports that he currently engages in sexual activity. The patient  has past surgical history that includes kidney stones.  His family history includes Diabetes in his father; Heart disease in his brother, father, and mother; Stroke in his mother.  Review of Systems    Constitutional: Negative for fever, chills and fatigue.  Eyes: Negative for blurred vision.  Respiratory: Negative for shortness of breath.   Cardiovascular: Negative for chest pain.  Gastrointestinal: Negative for nausea and abdominal pain.  Genitourinary: Negative.  Negative for impotence.  Skin: Negative for rash.  Neurological: Negative for dizziness, weakness and headaches.  Endo/Heme/Allergies: Negative for polydipsia.    OBJECTIVE  His  weight is 269 lb 3.2 oz (122.108 kg). His oral temperature is 98.5 F (36.9 C). His blood pressure is 128/88 and his pulse is 81. His respiration is 18 and oxygen saturation is 97%.  The patient's body mass index is 35.03 kg/(m^2).  Physical Exam  Vitals reviewed. Constitutional: He is oriented to person, place, and time. He appears well-developed. No distress.  Eyes: EOM are normal. Pupils are equal, round, and reactive to light. No scleral icterus.  Neck: Normal range of motion.  Cardiovascular: Normal rate and regular rhythm.   Respiratory: Effort normal and breath sounds normal.  GI: He exhibits no distension.  Musculoskeletal: Normal range of motion.  Neurological: He is alert and oriented to person, place, and time. No cranial nerve deficit.  Skin: Skin is warm and dry. No rash noted. He is not diaphoretic.  Psychiatric: He has a normal mood and affect.    Results for orders placed or performed in visit on 07/07/15 (from the past 24 hour(s))  POCT CBC     Status: Abnormal   Collection Time: 07/07/15  5:07 PM  Result Value Ref Range   WBC 8.8 4.6 - 10.2 K/uL   Lymph, poc 2.3 0.6 - 3.4   POC LYMPH PERCENT 25.7 10 - 50 %L   MID (cbc) 0.5  0 - 0.9   POC MID % 5.2 0 - 12 %M   POC Granulocyte 6.1 2 - 6.9   Granulocyte percent 69.1 37 - 80 %G   RBC 4.65 (A) 4.69 - 6.13 M/uL   Hemoglobin 13.8 (A) 14.1 - 18.1 g/dL   HCT, POC 69.640.3 (A) 29.543.5 - 53.7 %   MCV 86.6 80 - 97 fL   MCH, POC 29.6 27 - 31.2 pg   MCHC 34.2 31.8 - 35.4 g/dL   RDW,  POC 28.414.0 %   Platelet Count, POC 185 142 - 424 K/uL   MPV 8.2 0 - 99.8 fL    ASSESSMENT & PLAN  Greig Castillandrew was seen today for medication refill.  Diagnoses and all orders for this visit:  Type 2 diabetes mellitus without complication, without long-term current use of insulin (HCC) -     Cancel: CBC -     COMPLETE METABOLIC PANEL WITH GFR -     Lipid panel -     TSH -     Cancel: Hemoglobin A1c -     metFORMIN (GLUCOPHAGE) 500 MG tablet; Take 1 tablet (500 mg total) by mouth 2 (two) times daily with a meal. -     Microalbumin, urine -     Care order/instruction -     POCT CBC -     POCT glycosylated hemoglobin (Hb A1C)  Need for prophylactic measure: Patient with an ASCVD calculated risks of 15.9.  He will benefit from further risk factor control.  -     metFORMIN (GLUCOPHAGE) 500 MG tablet; Take 1 tablet (500 mg total) by mouth 2 (two) times daily with a meal. -     rosuvastatin (CRESTOR) 5 MG tablet; Take 1 tablet (5 mg total) by mouth daily. -     aspirin 81 MG tablet; Take 1 tablet (81 mg total) by mouth daily. Take one daily. -     Pneumococcal conjugate vaccine 13-valent IM      The patient was advised to call or come back to clinic if he does not see an improvement in symptoms, or worsens with the above plan.   Deliah BostonMichael Matei Magnone, MHS, PA-C [facility] 07/07/2015 5:41 PM

## 2015-07-08 LAB — COMPLETE METABOLIC PANEL WITH GFR
ALBUMIN: 3.9 g/dL (ref 3.6–5.1)
ALK PHOS: 73 U/L (ref 40–115)
ALT: 13 U/L (ref 9–46)
AST: 22 U/L (ref 10–40)
BUN: 21 mg/dL (ref 7–25)
CO2: 26 mmol/L (ref 20–31)
Calcium: 9.2 mg/dL (ref 8.6–10.3)
Chloride: 104 mmol/L (ref 98–110)
Creat: 1.04 mg/dL (ref 0.60–1.35)
GFR, EST NON AFRICAN AMERICAN: 84 mL/min (ref >=60)
GFR, Est African American: >89 mL/min (ref >=60)
Glucose, Bld: 175 mg/dL — ABNORMAL HIGH (ref 65–99)
Potassium: 4 mmol/L (ref 3.5–5.3)
SODIUM: 138 mmol/L (ref 135–146)
Total Bilirubin: 0.5 mg/dL (ref 0.2–1.2)
Total Protein: 6.8 g/dL (ref 6.1–8.1)

## 2015-07-08 LAB — LIPID PANEL
Cholesterol: 162 mg/dL (ref 125–200)
HDL: 19 mg/dL — ABNORMAL LOW (ref >=40)
Total CHOL/HDL Ratio: 8.5 Ratio — ABNORMAL HIGH (ref <=5.0)
Triglycerides: 852 mg/dL — ABNORMAL HIGH (ref <150)

## 2015-07-08 LAB — TSH: TSH: 1.358 u[IU]/mL (ref 0.350–4.500)

## 2015-07-08 LAB — MICROALBUMIN, URINE: MICROALB UR: 0.4 mg/dL

## 2015-07-09 ENCOUNTER — Ambulatory Visit: Payer: Self-pay | Admitting: Physician Assistant

## 2015-07-09 ENCOUNTER — Encounter: Payer: Self-pay | Admitting: Family Medicine

## 2015-08-05 ENCOUNTER — Other Ambulatory Visit: Payer: Self-pay

## 2015-08-05 ENCOUNTER — Encounter: Payer: Self-pay | Admitting: Family Medicine

## 2015-08-05 DIAGNOSIS — Z299 Encounter for prophylactic measures, unspecified: Secondary | ICD-10-CM

## 2015-08-05 DIAGNOSIS — E119 Type 2 diabetes mellitus without complications: Secondary | ICD-10-CM

## 2015-08-05 MED ORDER — METFORMIN HCL 500 MG PO TABS: 500.0000 mg | ORAL_TABLET | Freq: Two times a day (BID) | ORAL | Status: DC

## 2016-01-05 ENCOUNTER — Ambulatory Visit: Payer: Self-pay | Admitting: Physician Assistant

## 2016-02-25 ENCOUNTER — Other Ambulatory Visit: Payer: Self-pay | Admitting: Physician Assistant

## 2016-06-08 ENCOUNTER — Other Ambulatory Visit: Payer: Self-pay | Admitting: Physician Assistant

## 2016-07-17 ENCOUNTER — Other Ambulatory Visit: Payer: Self-pay | Admitting: Physician Assistant

## 2016-07-31 ENCOUNTER — Other Ambulatory Visit: Payer: Self-pay | Admitting: Physician Assistant

## 2016-08-15 ENCOUNTER — Other Ambulatory Visit: Payer: Self-pay | Admitting: Physician Assistant
